# Patient Record
Sex: Male | Born: 1995 | Race: White | Hispanic: No | Marital: Married | Smoking: Never smoker
Health system: Southern US, Community
[De-identification: ages and names within clinical notes are randomized; demographics above are authoritative.]

## PROBLEM LIST (undated history)

## (undated) DIAGNOSIS — J189 Pneumonia, unspecified organism: Secondary | ICD-10-CM

---
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
## 2018-03-15 ENCOUNTER — Emergency Department (HOSPITAL_COMMUNITY)
Admission: EM | Admit: 2018-03-15 | Discharge: 2018-03-15 | Disposition: A | Discharge status: Home / Self Care | Payer: Managed Care, Other (non HMO) | Attending: Emergency Medicine | Admitting: Emergency Medicine

## 2018-03-15 ENCOUNTER — Encounter (HOSPITAL_COMMUNITY): Payer: Self-pay

## 2018-03-15 ENCOUNTER — Emergency Department (HOSPITAL_COMMUNITY): Payer: Managed Care, Other (non HMO)

## 2018-03-15 DIAGNOSIS — R0600 Dyspnea, unspecified: Secondary | ICD-10-CM | POA: Insufficient documentation

## 2018-03-15 DIAGNOSIS — R0602 Shortness of breath: Secondary | ICD-10-CM | POA: Diagnosis present

## 2018-03-15 HISTORY — DX: Pneumonia, unspecified organism: J18.9

## 2018-03-15 LAB — CBC WITH DIFFERENTIAL/PLATELET
Abs Immature Granulocytes: 0.02 10*3/uL (ref 0.00–0.07)
Basophils Absolute: 0 10*3/uL (ref 0.0–0.1)
Basophils Relative: 0 %
Eosinophils Absolute: 0.1 10*3/uL (ref 0.0–0.5)
Eosinophils Relative: 2 %
HCT: 44.7 % (ref 39.0–52.0)
Hemoglobin: 14.7 g/dL (ref 13.0–17.0)
IMMATURE GRANULOCYTES: 0 %
Lymphocytes Relative: 35 %
Lymphs Abs: 2.4 10*3/uL (ref 0.7–4.0)
MCH: 30.4 pg (ref 26.0–34.0)
MCHC: 32.9 g/dL (ref 30.0–36.0)
MCV: 92.5 fL (ref 80.0–100.0)
Monocytes Absolute: 0.5 10*3/uL (ref 0.1–1.0)
Monocytes Relative: 7 %
NEUTROS PCT: 56 %
Neutro Abs: 3.7 10*3/uL (ref 1.7–7.7)
Platelets: 240 10*3/uL (ref 150–400)
RBC: 4.83 MIL/uL (ref 4.22–5.81)
RDW: 12.3 % (ref 11.5–15.5)
WBC: 6.7 10*3/uL (ref 4.0–10.5)
nRBC: 0 % (ref 0.0–0.2)

## 2018-03-15 LAB — BASIC METABOLIC PANEL
Anion gap: 7 (ref 5–15)
BUN: 15 mg/dL (ref 6–20)
CO2: 25 mmol/L (ref 22–32)
Calcium: 9 mg/dL (ref 8.9–10.3)
Chloride: 107 mmol/L (ref 98–111)
Creatinine, Ser: 1.1 mg/dL (ref 0.61–1.24)
GFR calc Af Amer: >60 mL/min (ref >60)
GFR calc non Af Amer: >60 mL/min (ref >60)
Glucose, Bld: 91 mg/dL (ref 70–99)
Potassium: 4.5 mmol/L (ref 3.5–5.1)
Sodium: 139 mmol/L (ref 135–145)

## 2018-03-15 LAB — D-DIMER, QUANTITATIVE (NOT AT ARMC)

## 2018-03-15 LAB — TROPONIN I: Troponin I: <0.03 ng/mL (ref <0.03)

## 2018-03-15 NOTE — ED Triage Notes (Signed)
Patient states he left the gym and had SOB one week. Patient states that he also has soreness and a sharp pain mid back under his right shoulder blade. patient states the pain is worse when he takes a deep breath.

## 2018-03-15 NOTE — Discharge Instructions (Addendum)
Ibuprofen 600 mg every 6 hours as needed for pain.  Return to the emergency department if you develop worsening pain, worsening breathing, high fever, or other new and concerning symptoms.

## 2018-03-15 NOTE — ED Notes (Signed)
ED Provider at bedside. 

## 2018-03-15 NOTE — ED Provider Notes (Signed)
[facility]-EMERGENCY DEPT Provider Note   CSN: [id number] Arrival date & time: 03/15/18  1128    History   Chief Complaint Chief Complaint  Patient presents with  . Shortness of Breath    HPI JAQUAIN APPLEGATE is a 23 y.o. male.     Patient is a 23 year old male with no significant past medical history.  He presents today with shortness of breath.  He states that since earlier this week he has had dyspnea.  This began several days ago while he was in the gym in the absence of any injury or trauma.  Yesterday he began developing pain to his right back just under his shoulder blade.  He denies any fevers or chills.  He denies any productive cough.  He denies any leg pain or swelling.  The history is provided by the patient.  Shortness of Breath  Severity:  Moderate Onset quality:  Gradual Duration:  5 days Timing:  Constant Progression:  Worsening Chronicity:  New Relieved by:  Nothing Worsened by:  Nothing Ineffective treatments:  None tried Associated symptoms: no cough and no fever     Past Medical History:  Diagnosis Date  . Pneumonia     Patient Active Problem List   Diagnosis Date Noted  . RHINITIS 11/14/2008    History reviewed. No pertinent surgical history.      Home Medications    Prior to Admission medications   Not on File    Family History History reviewed. No pertinent family history.  Social History Social History   Tobacco Use  . Smoking status: Never Smoker  . Smokeless tobacco: Never Used  Substance Use Topics  . Alcohol use: Yes    Comment: occasionally  . Drug use: Yes    Types: Marijuana    Comment: once a wek     Allergies   Amoxicillin   Review of Systems Review of Systems  Constitutional: Negative for fever.  Respiratory: Positive for shortness of breath. Negative for cough.   All other systems reviewed and are negative.    Physical Exam Updated Vital Signs BP (!) 161/99 (BP Location:  Right Arm)   Pulse 84   Temp 97.8 F (36.6 C) (Oral)   Resp 16   SpO2 98%   Physical Exam Vitals signs and nursing note reviewed.  Constitutional:      General: He is not in acute distress.    Appearance: He is well-developed. He is not diaphoretic.  HENT:     Head: Normocephalic and atraumatic.  Neck:     Musculoskeletal: Normal range of motion and neck supple.  Cardiovascular:     Rate and Rhythm: Normal rate and regular rhythm.     Heart sounds: No murmur. No friction rub.  Pulmonary:     Effort: Pulmonary effort is normal. No respiratory distress.     Breath sounds: Normal breath sounds. No wheezing or rales.  Abdominal:     General: Bowel sounds are normal. There is no distension.     Palpations: Abdomen is soft.     Tenderness: There is no abdominal tenderness.  Musculoskeletal: Normal range of motion.  Skin:    General: Skin is warm and dry.  Neurological:     Mental Status: He is alert and oriented to person, place, and time.     Coordination: Coordination normal.      ED Treatments / Results  Labs (all labs ordered are listed, but only abnormal results are displayed) Labs Reviewed  BASIC METABOLIC PANEL  CBC WITH DIFFERENTIAL/PLATELET  D-DIMER, QUANTITATIVE (NOT AT [facility])  TROPONIN I    EKG EKG Interpretation  Date/Time:  Wednesday March 15 2018 12:12:18 EST Ventricular Rate:  74 PR Interval:    QRS Duration: 90 QT Interval:  372 QTC Calculation: 413 R Axis:   56 Text Interpretation:  Sinus rhythm Early Repolarization Confirmed by Geoffery Lyons ([id number]) on 03/15/2018 12:14:33 PM   Radiology No results found.  Procedures Procedures (including critical care time)  Medications Ordered in ED Medications - No data to display   Initial Impression / Assessment and Plan / ED Course  I have reviewed the triage vital signs and the nursing notes.  Pertinent labs & imaging results that were available during my care of the patient were reviewed by  me and considered in my medical decision making (see chart for details).  Patient is a 23 year old male with no significant past medical history.  He presents with a one-week history of intermittent shortness of breath.  Yesterday evening he developed discomfort to his right upper back and presents for evaluation of this.  Today's work-up reveals a normal EKG, negative troponin, clear chest x-ray, and negative d-dimer.  I feel as though I have effectively excluded any emergent pathology.  Patient vital signs are stable and oxygen saturations are in the upper 90s.  Patient will be discharged, to return as needed for any problems.  Final Clinical Impressions(s) / ED Diagnoses   Final diagnoses:  None    ED Discharge Orders    None       Geoffery Lyons, MD 03/15/18 1338

## 2018-03-15 NOTE — ED Notes (Signed)
Patient transported to X-ray 

## 2020-03-16 ENCOUNTER — Emergency Department (HOSPITAL_COMMUNITY)
Admission: EM | Admit: 2020-03-16 | Discharge: 2020-03-16 | Disposition: A | Discharge status: Home / Self Care | Payer: No Typology Code available for payment source | Attending: Emergency Medicine | Admitting: Emergency Medicine

## 2020-03-16 ENCOUNTER — Other Ambulatory Visit: Payer: Self-pay

## 2020-03-16 DIAGNOSIS — S0081XA Abrasion of other part of head, initial encounter: Secondary | ICD-10-CM | POA: Insufficient documentation

## 2020-03-16 DIAGNOSIS — S60511A Abrasion of right hand, initial encounter: Secondary | ICD-10-CM | POA: Insufficient documentation

## 2020-03-16 DIAGNOSIS — Y92410 Unspecified street and highway as the place of occurrence of the external cause: Secondary | ICD-10-CM | POA: Diagnosis not present

## 2020-03-16 DIAGNOSIS — S60512A Abrasion of left hand, initial encounter: Secondary | ICD-10-CM | POA: Diagnosis not present

## 2020-03-16 DIAGNOSIS — R55 Syncope and collapse: Secondary | ICD-10-CM | POA: Insufficient documentation

## 2020-03-16 DIAGNOSIS — T07XXXA Unspecified multiple injuries, initial encounter: Secondary | ICD-10-CM

## 2020-03-16 LAB — COMPREHENSIVE METABOLIC PANEL
ALT: 29 U/L (ref 0–44)
AST: 27 U/L (ref 15–41)
Albumin: 4.1 g/dL (ref 3.5–5.0)
Alkaline Phosphatase: 37 U/L — ABNORMAL LOW (ref 38–126)
Anion gap: 8 (ref 5–15)
BUN: 15 mg/dL (ref 6–20)
CO2: 26 mmol/L (ref 22–32)
Calcium: 9 mg/dL (ref 8.9–10.3)
Chloride: 102 mmol/L (ref 98–111)
Creatinine, Ser: 1 mg/dL (ref 0.61–1.24)
GFR, Estimated: >60 mL/min (ref >60)
Glucose, Bld: 115 mg/dL — ABNORMAL HIGH (ref 70–99)
Potassium: 4.1 mmol/L (ref 3.5–5.1)
Sodium: 136 mmol/L (ref 135–145)
Total Bilirubin: 0.5 mg/dL (ref 0.3–1.2)
Total Protein: 6.8 g/dL (ref 6.5–8.1)

## 2020-03-16 LAB — CBC WITH DIFFERENTIAL/PLATELET
Abs Immature Granulocytes: 0.05 10*3/uL (ref 0.00–0.07)
Basophils Absolute: 0 10*3/uL (ref 0.0–0.1)
Basophils Relative: 0 %
Eosinophils Absolute: 0.1 10*3/uL (ref 0.0–0.5)
Eosinophils Relative: 1 %
HCT: 40.8 % (ref 39.0–52.0)
Hemoglobin: 13.8 g/dL (ref 13.0–17.0)
Immature Granulocytes: 1 %
Lymphocytes Relative: 15 %
Lymphs Abs: 1.7 10*3/uL (ref 0.7–4.0)
MCH: 30.6 pg (ref 26.0–34.0)
MCHC: 33.8 g/dL (ref 30.0–36.0)
MCV: 90.5 fL (ref 80.0–100.0)
Monocytes Absolute: 0.6 10*3/uL (ref 0.1–1.0)
Monocytes Relative: 6 %
Neutro Abs: 8.5 10*3/uL — ABNORMAL HIGH (ref 1.7–7.7)
Neutrophils Relative %: 77 %
Platelets: 225 10*3/uL (ref 150–400)
RBC: 4.51 MIL/uL (ref 4.22–5.81)
RDW: 12.2 % (ref 11.5–15.5)
WBC: 10.9 10*3/uL — ABNORMAL HIGH (ref 4.0–10.5)
nRBC: 0 % (ref 0.0–0.2)

## 2020-03-16 LAB — URINALYSIS, ROUTINE W REFLEX MICROSCOPIC
Bilirubin Urine: NEGATIVE
Glucose, UA: NEGATIVE mg/dL
Hgb urine dipstick: NEGATIVE
Ketones, ur: NEGATIVE mg/dL
Leukocytes,Ua: NEGATIVE
Nitrite: NEGATIVE
Protein, ur: NEGATIVE mg/dL
Specific Gravity, Urine: 1.019 (ref 1.005–1.030)
pH: 6 (ref 5.0–8.0)

## 2020-03-16 LAB — CBG MONITORING, ED: Glucose-Capillary: 94 mg/dL (ref 70–99)

## 2020-03-16 MED ORDER — TETANUS-DIPHTH-ACELL PERTUSSIS 5-2.5-18.5 LF-MCG/0.5 IM SUSY
0.5000 mL | PREFILLED_SYRINGE | Freq: Once | INTRAMUSCULAR | Status: DC
  Filled 2020-03-16: qty 0.5

## 2020-03-16 MED ORDER — SODIUM CHLORIDE 0.9 % IV BOLUS
1000.0000 mL | Freq: Once | INTRAVENOUS | Status: AC
  Administered 2020-03-16: 1000 mL via INTRAVENOUS
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
## 2020-03-16 NOTE — Discharge Instructions (Signed)
The cause of your syncopal episode is unclear.  You need to avoid driving until you follow-up with your primary care physician for further evaluation.
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
## 2020-05-13 ENCOUNTER — Other Ambulatory Visit (HOSPITAL_COMMUNITY): Payer: Self-pay

## 2020-09-08 NOTE — Progress Notes (Signed)
Subjective:    Steven Burke - 25 y.o. male MRN [id number]  Date of birth: October 30, 1995  HPI  Steven Burke is to establish care. He is accompanied by his significant other.  Current issues and/or concerns: Reports on auto-titration CPAP nightly. Last sleep study at least 3 years ago. Reports at that time had mild sleep apnea. Since then has lost 50 pounds. Recently feeling as if the CPAP is choking him and awakening him throughout the night and would like to know if he should continue. 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 His employer monitors that he has at least 70% compliance of CPAP use or his job may be under subjection.    ROS per HPI    Health Maintenance:  Health Maintenance Due  Topic Date Due   COVID-19 Vaccine (1) Never done   HIV Screening  Never done   Hepatitis C Screening  Never done   TETANUS/TDAP  Never done   INFLUENZA VACCINE  08/25/2020     Past Medical History: Patient Active Problem List   Diagnosis Date Noted   Sleep apnea 09/10/2020   RHINITIS 11/14/2008      Social History   reports that he has never smoked. He has never used smokeless tobacco. He reports current alcohol use of about 4.0 standard drinks per week. He reports current drug use. Drug: Marijuana.   Family History  family history is not on file.   Medications: reviewed and updated   Objective:   Physical Exam BP 133/84 (BP Location: Left Arm, Patient Position: Sitting, Cuff Size: Large)   Pulse 78   Temp 98.3 F (36.8 C)   Resp 16   Ht 6' 2.37" (1.889 m)   Wt 285 lb (129.3 kg)   SpO2 95%   BMI 36.23 kg/m   Physical Exam HENT:     Head: Normocephalic and atraumatic.  Eyes:     Extraocular Movements: Extraocular movements intact.     Conjunctiva/sclera: Conjunctivae normal.     Pupils: Pupils are equal, round, and reactive to light.  Cardiovascular:     Rate and Rhythm: Normal rate and regular rhythm.      Pulses: Normal pulses.     Heart sounds: Normal heart sounds.  Pulmonary:     Effort: Pulmonary effort is normal.     Breath sounds: Normal breath sounds.  Musculoskeletal:     Cervical back: Normal range of motion and neck supple.  Neurological:     General: No focal deficit present.     Mental Status: He is alert and oriented to person, place, and time.  Psychiatric:        Mood and Affect: Mood normal.        Behavior: Behavior normal.       Assessment & Plan:  1. Encounter to establish care: - Patient presents today to establish care.  - Return for annual physical examination, labs, and health maintenance. Arrive fasting meaning having no food for at least 8 hours prior to appointment. You may have only water or black coffee. Please take scheduled medications as normal.  2. Obstructive sleep apnea treated with continuous positive airway pressure (CPAP): - Patient's last sleep study was 3 years ago. Began on auto-titration CPAP at that time.  - Today patient has concerns that CPAP is choking him and awakening him throughout the night. Would like to know if he should continue use. He is a [employer] architect and  employer monitors that he is 70% compliant on CPAP machine.  - Patient would benefit from updated sleep study to evaluate indication for further use of CPAP.  - Referral to Sleep Studies for further evaluation and management. - Follow-up with primary provider as scheduled.  - Ambulatory referral to Sleep Studies - [facility] Sleep Study; Future     Patient was given clear instructions to go to Emergency Department or return to medical center if symptoms don't improve, worsen, or new problems develop.The patient verbalized understanding.  I discussed the assessment and treatment plan with the patient. The patient was provided an opportunity to ask questions and all were answered. The patient agreed with the plan and demonstrated an understanding of the instructions.   The patient was  advised to call back or seek an in-person evaluation if the symptoms worsen or if the condition fails to improve as anticipated.    [name], NP 09/10/2020, 10:30 AM Primary Care at [facility]

## 2020-09-10 ENCOUNTER — Other Ambulatory Visit: Payer: Self-pay

## 2020-09-10 ENCOUNTER — Ambulatory Visit (INDEPENDENT_AMBULATORY_CARE_PROVIDER_SITE_OTHER): Payer: Medicaid Other | Admitting: Family

## 2020-09-10 ENCOUNTER — Encounter: Payer: Self-pay | Admitting: Family

## 2020-09-10 VITALS — BP 133/84 | HR 78 | Temp 98.3°F | Resp 16 | Ht 74.37 in | Wt 285.0 lb

## 2020-09-10 DIAGNOSIS — Z7689 Persons encountering health services in other specified circumstances: Secondary | ICD-10-CM

## 2020-09-10 DIAGNOSIS — G4733 Obstructive sleep apnea (adult) (pediatric): Secondary | ICD-10-CM

## 2020-09-10 DIAGNOSIS — Z9989 Dependence on other enabling machines and devices: Secondary | ICD-10-CM | POA: Diagnosis not present

## 2020-09-10 DIAGNOSIS — G4730 Sleep apnea, unspecified: Secondary | ICD-10-CM | POA: Insufficient documentation

## 2020-09-10 NOTE — Progress Notes (Signed)
Pt presents to establish care pt reports that he completed sleep study 3 years ago when he was overweight and not sure if still need CPAP machine

## 2020-09-10 NOTE — Patient Instructions (Addendum)
- Return for annual physical examination, labs, and health maintenance. Arrive fasting meaning having no food for at least 8 hours prior to appointment. You may have only water or black coffee. Please take scheduled medications as normal.  Thank you for choosing Primary Care at [facility] for your medical home!    Steven Burke was seen by [name], NP today.   Steven Burke's primary care provider is [name] [name], NP.   For the best care possible,  you should try to see [name], NP whenever you come to clinic.   We look forward to seeing you again soon!  If you have any questions about your visit today,  please call us at [phone]  Or feel free to reach your provider via MyChart.    Keeping you healthy   Get these tests Blood pressure- Have your blood pressure checked once a year by your healthcare provider.  Normal blood pressure is 120/80. Weight- Have your body mass index (BMI) calculated to screen for obesity.  BMI is a measure of body fat based on height and weight. You can also calculate your own BMI at [email or web address]-esparza.[email or web address] Cholesterol- Have your cholesterol checked regularly starting at age 40, sooner may be necessary if you have diabetes, high blood pressure, if a family member developed heart diseases at an early age or if you smoke.  Chlamydia, HIV, and other sexual transmitted disease- Get screened each year until the age of 43 then within three months of each new sexual partner. Diabetes- Have your blood sugar checked regularly if you have high blood pressure, high cholesterol, a family history of diabetes or if you are overweight.   Get these vaccines Flu shot- Every fall. Tetanus shot- Every 10 years. Menactra- Single dose; prevents meningitis.   Take these steps Don't smoke- If you do smoke, ask your healthcare provider about quitting. For tips on how to quit, go to www.smokefree.gov or call 1-800-QUIT-NOW. Be physically active- Exercise 5  days a week for at least 30 minutes.  If you are not already physically active start slow and gradually work up to 30 minutes of moderate physical activity.  Examples of moderate activity include walking briskly, mowing the yard, dancing, swimming bicycling, etc. Eat a healthy diet- Eat a variety of healthy foods such as fruits, vegetables, low fat milk, low fat cheese, yogurt, lean meats, poultry, fish, beans, tofu, etc.  For more information on healthy eating, go to www.thenutritionsource.org Drink alcohol in moderation- Limit alcohol intake two drinks or less a day.  Never drink and drive. Dentist- Brush and floss teeth twice daily; visit your dentis twice a year. Depression-Your emotional health is as important as your physical health.  If you're feeling down, losing interest in things you normally enjoy please talk with your healthcare provider. Gun Safety- If you keep a gun in your home, keep it unloaded and with the safety lock on.  Bullets should be stored separately. Helmet use- Always wear a helmet when riding a motorcycle, bicycle, rollerblading or skateboarding. Safe sex- If you may be exposed to a sexually transmitted infection, use a condom Seat belts- Seat bels can save your life; always wear one. Smoke/Carbon Monoxide detectors- These detectors need to be installed on the appropriate level of your home.  Replace batteries at least once a year. Skin Cancer- When out in the sun, cover up and use sunscreen SPF 15 or higher. Violence- If anyone is threatening or hurting you, please tell your healthcare  provider.

## 2020-10-20 IMAGING — CR DG CHEST 2V
2 series · 2 of 2 positions shown · non-contrast
Comparison: None.

CLINICAL DATA: 23-year-old male with a history of worsening
shortness of breath

EXAM:
CHEST - 2 VIEW

[w chest pa]
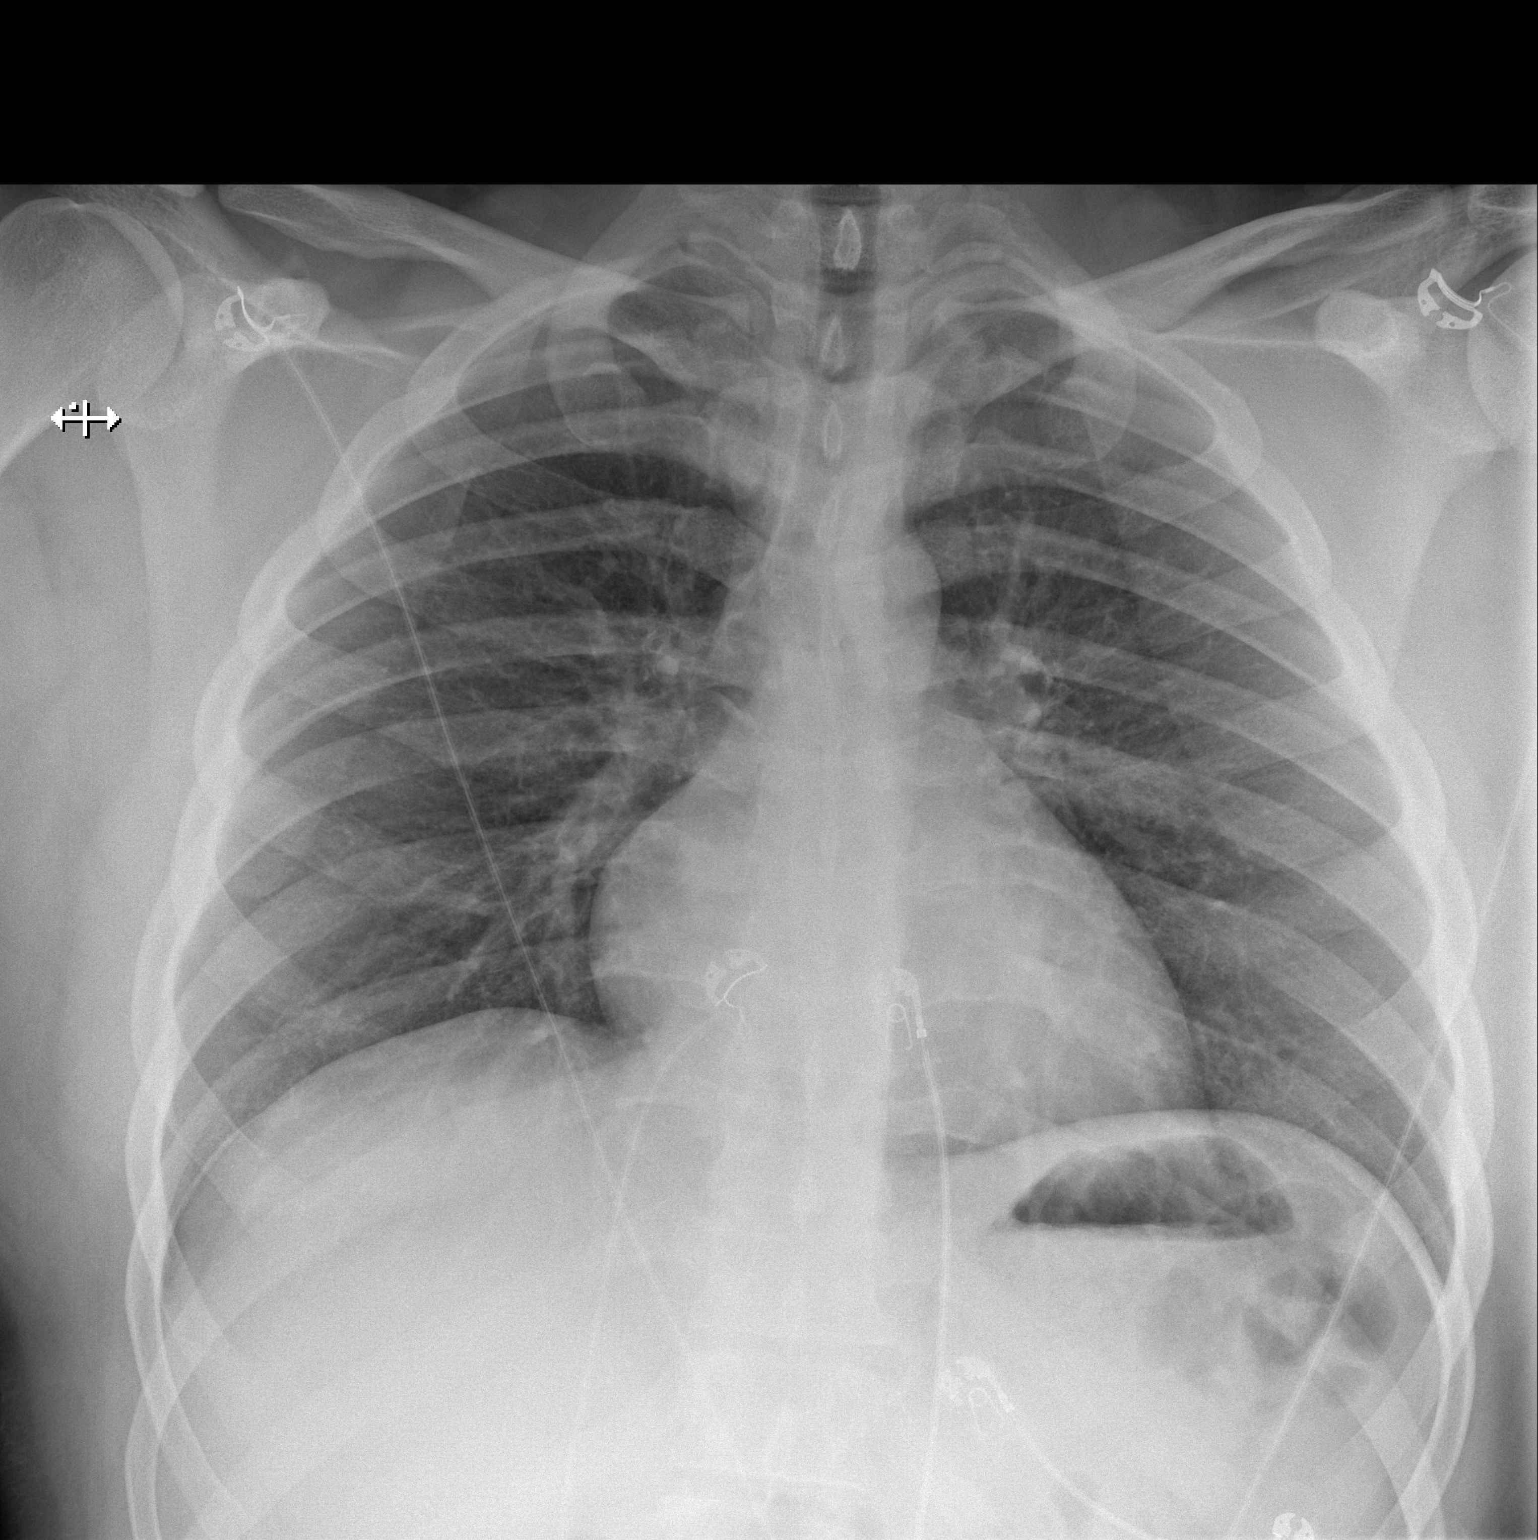

[w chest lat]
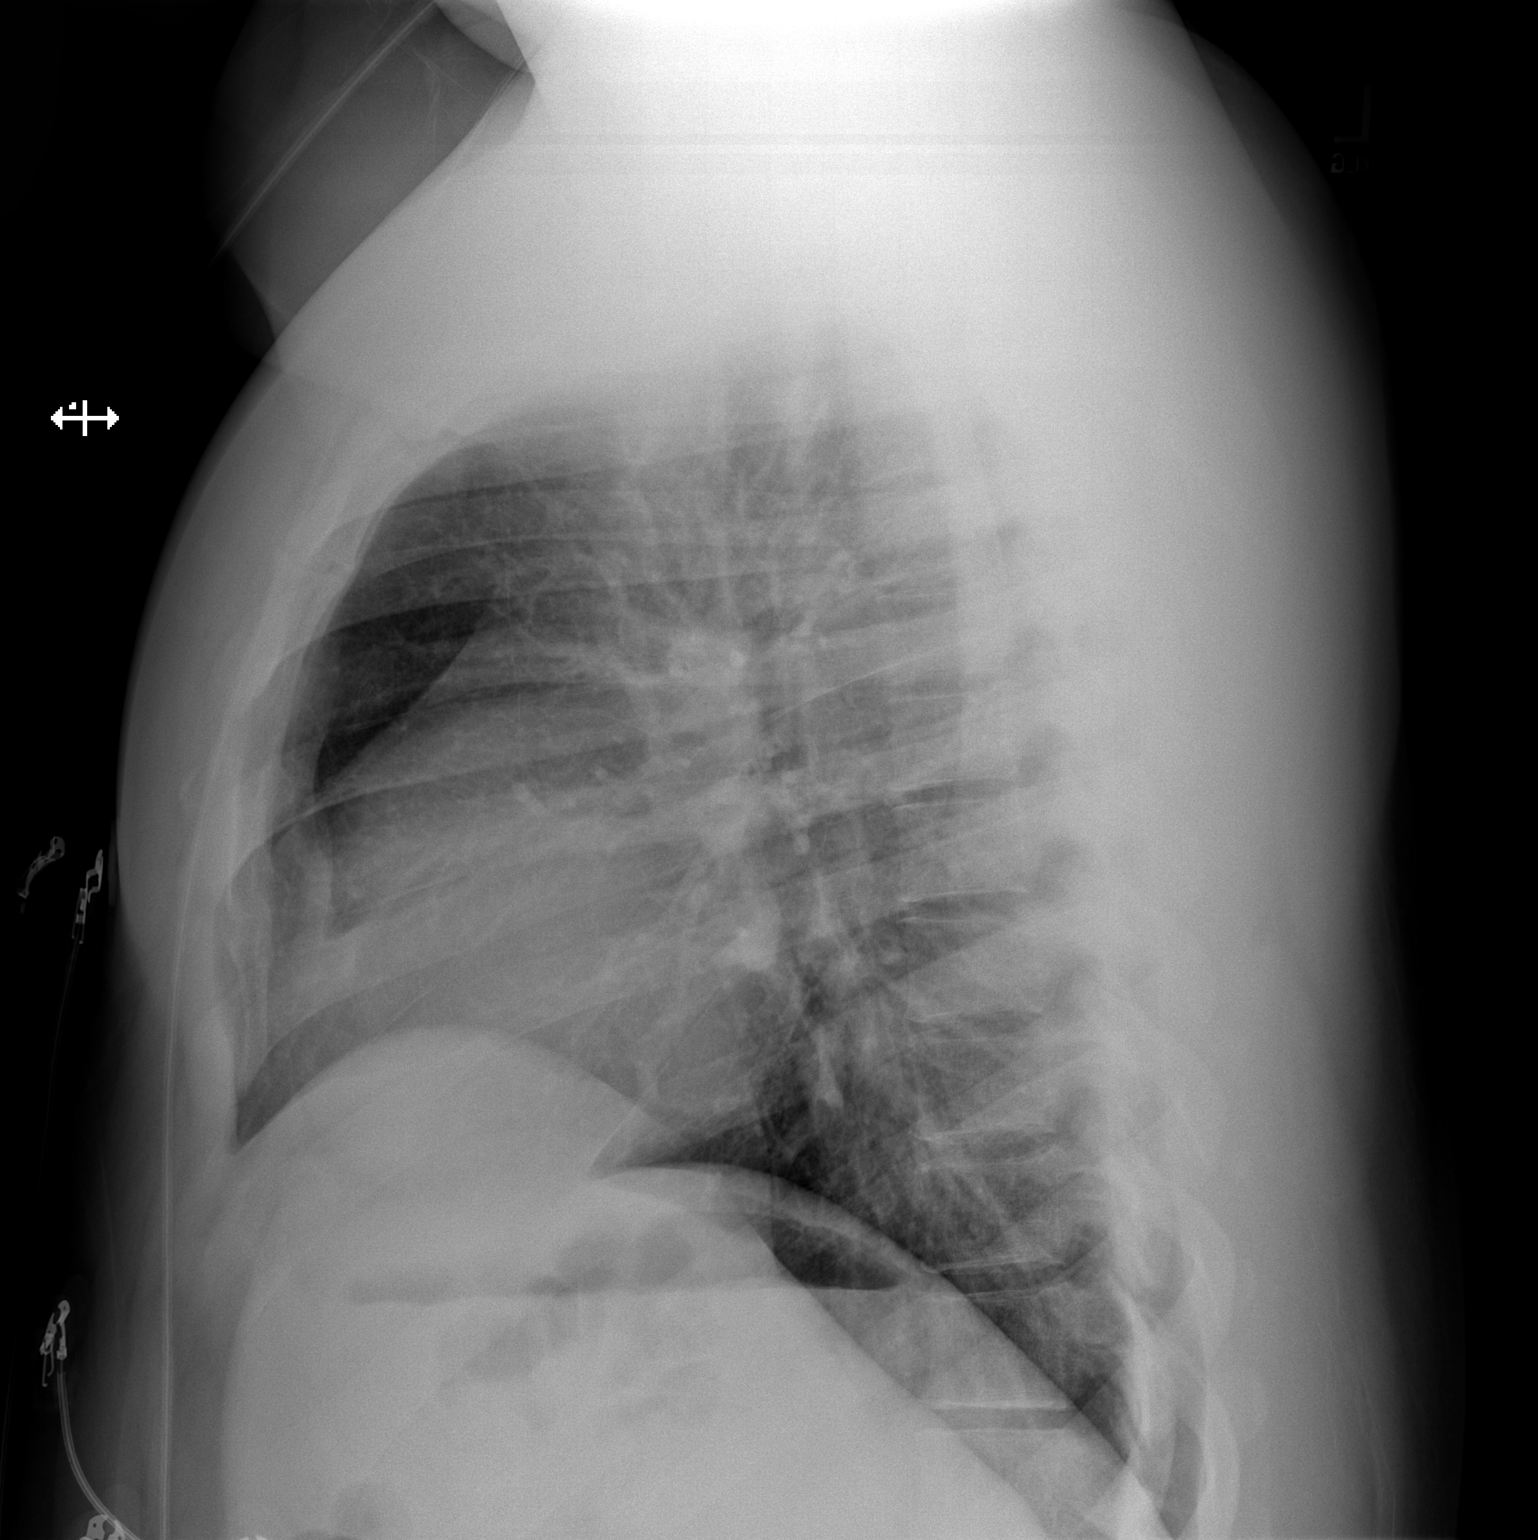

[2 of 2 positions shown; findings below may reference images not displayed]

FINDINGS: Cardiomediastinal silhouette within normal limits. No evidence of
central vascular congestion. No pneumothorax or pleural effusion. No
confluent airspace disease. No displaced fracture.
IMPRESSION: Negative for acute cardiopulmonary disease

## 2020-10-23 NOTE — Progress Notes (Signed)
Erroneous encounter

## 2020-10-27 ENCOUNTER — Encounter: Payer: Medicaid Other | Admitting: Family

## 2020-10-27 DIAGNOSIS — Z114 Encounter for screening for human immunodeficiency virus [HIV]: Secondary | ICD-10-CM

## 2020-10-27 DIAGNOSIS — Z1159 Encounter for screening for other viral diseases: Secondary | ICD-10-CM

## 2020-10-27 DIAGNOSIS — Z130 Encounter for screening for diseases of the blood and blood-forming organs and certain disorders involving the immune mechanism: Secondary | ICD-10-CM

## 2020-10-27 DIAGNOSIS — Z1329 Encounter for screening for other suspected endocrine disorder: Secondary | ICD-10-CM

## 2020-10-27 DIAGNOSIS — Z0000 Encounter for general adult medical examination without abnormal findings: Secondary | ICD-10-CM

## 2020-10-27 DIAGNOSIS — Z131 Encounter for screening for diabetes mellitus: Secondary | ICD-10-CM

## 2020-10-27 DIAGNOSIS — Z13228 Encounter for screening for other metabolic disorders: Secondary | ICD-10-CM

## 2020-10-27 DIAGNOSIS — Z13220 Encounter for screening for lipoid disorders: Secondary | ICD-10-CM

## 2020-11-29 NOTE — Progress Notes (Signed)
Erroneous encounter

## 2020-12-01 ENCOUNTER — Encounter: Payer: Medicaid Other | Admitting: Family

## 2020-12-01 DIAGNOSIS — Z0000 Encounter for general adult medical examination without abnormal findings: Secondary | ICD-10-CM

## 2020-12-01 DIAGNOSIS — Z130 Encounter for screening for diseases of the blood and blood-forming organs and certain disorders involving the immune mechanism: Secondary | ICD-10-CM

## 2020-12-01 DIAGNOSIS — Z13220 Encounter for screening for lipoid disorders: Secondary | ICD-10-CM

## 2020-12-01 DIAGNOSIS — Z1329 Encounter for screening for other suspected endocrine disorder: Secondary | ICD-10-CM

## 2020-12-01 DIAGNOSIS — Z1159 Encounter for screening for other viral diseases: Secondary | ICD-10-CM

## 2020-12-01 DIAGNOSIS — Z131 Encounter for screening for diabetes mellitus: Secondary | ICD-10-CM

## 2020-12-01 DIAGNOSIS — Z13228 Encounter for screening for other metabolic disorders: Secondary | ICD-10-CM

## 2020-12-01 DIAGNOSIS — Z114 Encounter for screening for human immunodeficiency virus [HIV]: Secondary | ICD-10-CM
